# Patient Record
Sex: Male | Born: 2000 | Race: White | Hispanic: No | Marital: Single | Smoking: Never smoker
Health system: Southern US, Community
[De-identification: ages and names within clinical notes are randomized; demographics above are authoritative.]

## PROBLEM LIST (undated history)

## (undated) DIAGNOSIS — Z9889 Other specified postprocedural states: Secondary | ICD-10-CM

## (undated) DIAGNOSIS — Z8489 Family history of other specified conditions: Secondary | ICD-10-CM

## (undated) DIAGNOSIS — F909 Attention-deficit hyperactivity disorder, unspecified type: Secondary | ICD-10-CM

## (undated) DIAGNOSIS — K219 Gastro-esophageal reflux disease without esophagitis: Secondary | ICD-10-CM

## (undated) DIAGNOSIS — F419 Anxiety disorder, unspecified: Secondary | ICD-10-CM

## (undated) DIAGNOSIS — R112 Nausea with vomiting, unspecified: Secondary | ICD-10-CM

## (undated) HISTORY — PX: WISDOM TOOTH EXTRACTION: SHX21

## (undated) HISTORY — PX: TONSILLECTOMY: SUR1361

## (undated) HISTORY — DX: Attention-deficit hyperactivity disorder, unspecified type: F90.9

## (undated) HISTORY — PX: ADENOIDECTOMY: SUR15

## (undated) HISTORY — PX: TYMPANOSTOMY TUBE PLACEMENT: SHX32

---
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
## 2019-09-09 ENCOUNTER — Telehealth (INDEPENDENT_AMBULATORY_CARE_PROVIDER_SITE_OTHER): Payer: Self-pay | Admitting: Surgery

## 2019-09-09 NOTE — Telephone Encounter (Signed)
  Who's calling (name and relationship to patient) :mom / Dagmar Hait  Best contact number:[phone]  Provider they see:Dr. Gus Puma  Reason for call:mom would like to speak with Dr. Gus Puma to see if her son could possibly be worked in while he is home from school. She also has a few questions for him. Please advise mom      PRESCRIPTION REFILL ONLY  Name of prescription:  Pharmacy:

## 2019-09-10 NOTE — Telephone Encounter (Signed)
Called mom back and relayed that I can not answer any questions due to Castorland being over 18. She just wants to make an appointment for him since he is away at college.

## 2019-09-24 ENCOUNTER — Telehealth (INDEPENDENT_AMBULATORY_CARE_PROVIDER_SITE_OTHER): Payer: Self-pay | Admitting: Nurse Practitioner

## 2019-09-24 NOTE — Telephone Encounter (Signed)
Spoke with Ms. Alvester Morin regarding Marcus [name]'s upcoming surgery appointment and scheduling. Will keep the current scheduled appointment.

## 2019-09-29 ENCOUNTER — Telehealth (INDEPENDENT_AMBULATORY_CARE_PROVIDER_SITE_OTHER): Payer: Self-pay

## 2019-09-29 ENCOUNTER — Ambulatory Visit (INDEPENDENT_AMBULATORY_CARE_PROVIDER_SITE_OTHER): Payer: 59 | Admitting: Surgery

## 2019-09-29 ENCOUNTER — Encounter (INDEPENDENT_AMBULATORY_CARE_PROVIDER_SITE_OTHER): Payer: Self-pay | Admitting: Surgery

## 2019-09-29 ENCOUNTER — Other Ambulatory Visit: Payer: Self-pay

## 2019-09-29 VITALS — BP 120/80 | HR 72 | Ht 75.16 in | Wt 182.4 lb

## 2019-09-29 DIAGNOSIS — Q676 Pectus excavatum: Secondary | ICD-10-CM

## 2019-09-29 NOTE — Telephone Encounter (Signed)
Called to get a Prior approval for upcoming Jan 13, 2020 surgery for the removal of the implant bar. Received approval from 12/03/19-03/02/20. Reference number for the call is [id number]

## 2019-09-29 NOTE — Progress Notes (Signed)
Referring Provider: Monna Fam, MD  I had the pleasure of seeing Marcus [name] and his mother in the surgery clinic again. As you may recall, Marcus [name] is a 19 y.o. male who returns to the clinic today for follow-up regarding:  Chief Complaint  Patient presents with  . Follow-up    s/p pectus bar placement 05/2017    Marcus [name] is a 19 year old male s/p bar placement for pectus excavatum in December 2018 who comes to clinic for evaluation prior to bar removal. [name] has been doing relatively well since the operation. There have not been any episodes of bar displacement. Marcus [name] is happy with the pectus repair.  Problem List/Medical History: Active Ambulatory Problems    Diagnosis Date Noted  . Congenital pectus excavatum 05/29/2017  . Pneumothorax, postoperative 05/29/2017   Resolved Ambulatory Problems    Diagnosis Date Noted  . No Resolved Ambulatory Problems   Past Medical History:  Diagnosis Date  . Acid reflux   . ADHD (attention deficit hyperactivity disorder)   . Anxiety   . Family history of adverse reaction to anesthesia   . PONV (postoperative nausea and vomiting)     Surgical History: Past Surgical History:  Procedure Laterality Date  . ADENOIDECTOMY     around 58 months old  . REPAIR PECTUS EXCAVATUMPEDIATRIC N/A 05/29/2017   Procedure: RECONSTRUCTIVE REPAIR PECTUS EXCAVATUM PEDIATRIC;  Surgeon: Stanford Scotland, MD;  Location: [facility];  Service: Pediatrics;  Laterality: N/A;  . TONSILLECTOMY     around 19 years old  . TYMPANOSTOMY TUBE PLACEMENT      Family History: Family History  Problem Relation Age of Onset  . Hypertrophic cardiomyopathy Sister   . Hypertrophic cardiomyopathy Father        very mild  . Hypertrophic cardiomyopathy Brother   . Von Willebrand disease Maternal Aunt        type 1a  . Breast cancer Maternal Aunt     Social History: Social History   Socioeconomic History  . Marital status: Single    Spouse name: Not on file  .  Number of children: Not on file  . Years of education: Not on file  . Highest education level: Not on file  Occupational History  . Not on file  Tobacco Use  . Smoking status: Never Smoker  . Smokeless tobacco: Never Used  Substance and Sexual Activity  . Alcohol use: No  . Drug use: No  . Sexual activity: Not on file  Other Topics Concern  . Not on file  Social History Narrative   Goes to college at [employer]   Social Determinants of Health   Financial Resource Strain:   . Difficulty of Paying Living Expenses:   Food Insecurity:   . Worried About Charity fundraiser in the Last Year:   . Arboriculturist in the Last Year:   Transportation Needs:   . Film/video editor (Medical):   Marland Kitchen Lack of Transportation (Non-Medical):   Physical Activity:   . Days of Exercise per Week:   . Minutes of Exercise per Session:   Stress:   . Feeling of Stress :   Social Connections:   . Frequency of Communication with Friends and Family:   . Frequency of Social Gatherings with Friends and Family:   . Attends Religious Services:   . Active Member of Clubs or Organizations:   . Attends Archivist Meetings:   Marland Kitchen Marital Status:   Intimate Production manager  Violence:   . Fear of Current or Ex-Partner:   . Emotionally Abused:   Marland Kitchen Physically Abused:   . Sexually Abused:     Allergies: No Known Allergies  Medications: Current Outpatient Medications on File Prior to Visit  Medication Sig Dispense Refill  . ampicillin (PRINCIPEN) 500 MG capsule Take 500 mg by mouth 2 (two) times daily.    . calcium carbonate (TUMS - DOSED IN MG ELEMENTAL CALCIUM) 500 MG chewable tablet Chew 2 tablets by mouth daily as needed for indigestion or heartburn.    . methylphenidate (DAYTRANA) 30 MG/9HR Place 30 mg onto the skin daily.    Marland Kitchen adapalene (DIFFERIN) 0.1 % gel Apply 1 application topically daily as needed (acne).    Marland Kitchen oxyCODONE (OXY IR/ROXICODONE) 5 MG immediate release tablet Take  1 tablet (5 mg total) by mouth every 4 (four) hours as needed for up to 10 doses for moderate pain or breakthrough pain (pain 5-7 of 10). (Patient not taking: Reported on 06/25/2017) 10 tablet 0  . senna-docusate (SENOKOT-S) 8.6-50 MG tablet Take 1 tablet by mouth at bedtime. (Patient not taking: Reported on 06/25/2017) 10 tablet 0  . venlafaxine (EFFEXOR) 50 MG tablet Take 50 mg by mouth at bedtime.   3   No current facility-administered medications on file prior to visit.    Review of Systems: Review of Systems  All other systems reviewed and are negative.    Today's Vitals   09/29/19 0956  BP: 120/80  Pulse: 72  Weight: 182 lb 6.4 oz (82.7 kg)  Height: 6' 3.16" (1.909 m)     Physical Exam: General: healthy, alert, appears stated age, not in distress Head, Ears, Nose, Throat: Normal Eyes: Normal Neck: Normal Lungs: Unlabored breathing Chest: normal appearing, well healed scars on lateral chest bilaterally Cardiac: regular rate and rhythm Abdomen: abdomen soft and non-tender Genital: deferred Rectal: deferred Musculoskeletal/Extremities: Normal symmetric bulk and strength Skin:No rashes or abnormal dyspigmentation Neuro: Mental status normal, no cranial nerve deficits, normal strength and tone, normal gait   Recent Studies: None  Assessment/Impression and Plan: Marcus [name] is a 19 year old male s/p bar placement for pectus excavatum. I recommend bar removal. I explained the procedure to Marcus [name] and his mother. Risks of the procedure include bleeding, injury (skin, muscle, nerves, vessels, ribs, heart, and lungs), infection, wound dehiscence, recurrence of pectus excavatum, sepsis, and death. Marcus [name] and mother understand the procedure and wish to proceed. Marcus [name] will be away for most of the summer, returning in August. We will schedule the operation for August 11 in the [name] operating room. I informed Marcus [name] that although the tentative plan is to discharge the same day,  disposition will depend on the outcome of the operation.  Thank you for allowing me to see this patient.    Kandice Hams, MD, [facility] Pediatric Surgeon

## 2019-12-30 NOTE — Progress Notes (Signed)
Pt made aware of the Covid drive-thru testing location change to [address] [facility].This is still a drive-thru, as well.

## 2020-01-04 ENCOUNTER — Telehealth (INDEPENDENT_AMBULATORY_CARE_PROVIDER_SITE_OTHER): Payer: Self-pay | Admitting: Surgery

## 2020-01-04 NOTE — Telephone Encounter (Signed)
I attempted to return Ms. Lakey's phone call. Left voicemail requesting a return call at [phone].

## 2020-01-04 NOTE — Telephone Encounter (Signed)
  Who's calling (name and relationship to patient) : Revonda Standard (mom)  Best contact number: [phone]  Provider they see: Dr. Gus Puma  Reason for call: Mom states that patient has surgery scheduled with Dr. Gus Puma on 8/11 - she has some questions and requests call back.    PRESCRIPTION REFILL ONLY  Name of prescription:  Pharmacy:

## 2020-01-04 NOTE — Telephone Encounter (Signed)
I called Marcus [name] to find out what questions mother may have. Marcus [name] stated they had a question on COVID testing date but he figured it out. Marcus [name] has no further questions.   Marcus Woulfe O. Trev Boley, MD, MHS

## 2020-01-04 NOTE — Telephone Encounter (Signed)
I spoke with Ms. Usery. She asked if [name] needed another x-ray prior to surgery, which I stated was not neccessary.  She stated [name] was very nervous about getting an IV on the day of surgery. She asked if it would be ok for [name] to take a xanax prior to arrival. I advised this would be a question for the anesthesiologist. Shalev should be receiving a pre-op phone call within the next few days. Ms. Dissinger verbalized understanding.

## 2020-01-09 ENCOUNTER — Other Ambulatory Visit (HOSPITAL_COMMUNITY)
Admission: RE | Admit: 2020-01-09 | Discharge: 2020-01-09 | Disposition: A | Discharge status: Home / Self Care | Payer: 59 | Source: Ambulatory Visit | Attending: Surgery | Admitting: Surgery

## 2020-01-09 DIAGNOSIS — Z01812 Encounter for preprocedural laboratory examination: Secondary | ICD-10-CM | POA: Insufficient documentation

## 2020-01-09 DIAGNOSIS — Z20822 Contact with and (suspected) exposure to covid-19: Secondary | ICD-10-CM | POA: Diagnosis not present

## 2020-01-09 LAB — SARS CORONAVIRUS 2 (TAT 6-24 HRS): SARS Coronavirus 2: NEGATIVE

## 2020-01-12 ENCOUNTER — Telehealth (INDEPENDENT_AMBULATORY_CARE_PROVIDER_SITE_OTHER): Payer: Self-pay | Admitting: Surgery

## 2020-01-12 ENCOUNTER — Encounter (HOSPITAL_COMMUNITY): Payer: Self-pay | Admitting: Surgery

## 2020-01-12 ENCOUNTER — Other Ambulatory Visit: Payer: Self-pay

## 2020-01-12 NOTE — Anesthesia Preprocedure Evaluation (Addendum)
Anesthesia Evaluation  Patient identified by MRN, date of birth, ID band Patient awake    Reviewed: Allergy & Precautions, NPO status , Patient's Chart, lab work & pertinent test results  History of Anesthesia Complications (+) PONV and history of anesthetic complications  Airway Mallampati: I  TM Distance: >3 FB Neck ROM: Full    Dental no notable dental hx. (+) Teeth Intact, Dental Advisory Given   Pulmonary neg pulmonary ROS,    Pulmonary exam normal breath sounds clear to auscultation       Cardiovascular negative cardio ROS Normal cardiovascular exam Rhythm:Regular Rate:Normal     Neuro/Psych PSYCHIATRIC DISORDERS Anxiety negative neurological ROS     GI/Hepatic Neg liver ROS, GERD  Controlled,  Endo/Other  negative endocrine ROS  Renal/GU negative Renal ROS  negative genitourinary   Musculoskeletal negative musculoskeletal ROS (+)   Abdominal   Peds  Hematology negative hematology ROS (+)   Anesthesia Other Findings Congenital pectus excavatum s/p nuss bar placement 2018, postop PTX  Reproductive/Obstetrics negative OB ROS                           Anesthesia Physical Anesthesia Plan  ASA: II  Anesthesia Plan: General   Post-op Pain Management:    Induction: Intravenous  PONV Risk Score and Plan: Ondansetron, Dexamethasone, Propofol infusion, Midazolam, Diphenhydramine and Treatment may vary due to age or medical condition  Airway Management Planned: Oral ETT  Additional Equipment: None  Intra-op Plan:   Post-operative Plan: Extubation in OR  Informed Consent: I have reviewed the patients History and Physical, chart, labs and discussed the procedure including the risks, benefits and alternatives for the proposed anesthesia with the patient or authorized representative who has indicated his/her understanding and acceptance.     Dental advisory given and Consent  reviewed with POA  Plan Discussed with: CRNA  Anesthesia Plan Comments: (D/w patient and parents rare possibility of blood transfusion, 2nd IV and intraop arterial line)      Anesthesia Quick Evaluation

## 2020-01-12 NOTE — Progress Notes (Signed)
Pt denies SOB, chest pain, and being under the care of a cardiologist. Pt stated That PCP is Dr. Aggie Hacker. Pt denies having an EKG, chest x ray and recent labs. Pt made aware to stop taking  Aspirin (unless otherwise advised by surgeon), vitamins, fish oil and herbal medications. Do not take any NSAIDs ie: Ibuprofen, Advil, Naproxen (Aleve), Motrin, BC and Goody Powder. Pt reminded to quarantine. Pt verbalized understanding of all pre-op instructions.

## 2020-01-12 NOTE — Telephone Encounter (Signed)
Returned [employer] phone call. Left message to call the office back.

## 2020-01-12 NOTE — Telephone Encounter (Signed)
Who's calling (name and relationship to patient) : Anastasio Wogan mom   Best contact number: [phone]  Provider they see: Dr. Gus Puma  Reason for call: Patient would like to know what time surgery is  Call ID:      PRESCRIPTION REFILL ONLY  Name of prescription:  Pharmacy:

## 2020-01-12 NOTE — Telephone Encounter (Signed)
I returned [name]'s phone call. I left a message stating Marcus [name] would receive a phone call from the [facility] at some point today regarding his surgery arrival time.

## 2020-01-13 ENCOUNTER — Ambulatory Visit (HOSPITAL_COMMUNITY): Payer: 59

## 2020-01-13 ENCOUNTER — Ambulatory Visit (HOSPITAL_COMMUNITY): Payer: 59 | Admitting: Anesthesiology

## 2020-01-13 ENCOUNTER — Encounter (HOSPITAL_COMMUNITY)
Admission: RE | Disposition: A | Discharge status: Home / Self Care | Payer: Self-pay | Source: Home / Self Care | Attending: Surgery

## 2020-01-13 ENCOUNTER — Observation Stay (HOSPITAL_COMMUNITY)
Admission: RE | Admit: 2020-01-13 | Discharge: 2020-01-14 | Disposition: A | Discharge status: Home / Self Care | Payer: 59 | Attending: Surgery | Admitting: Surgery

## 2020-01-13 ENCOUNTER — Other Ambulatory Visit: Payer: Self-pay

## 2020-01-13 DIAGNOSIS — Q676 Pectus excavatum: Principal | ICD-10-CM | POA: Insufficient documentation

## 2020-01-13 DIAGNOSIS — F909 Attention-deficit hyperactivity disorder, unspecified type: Secondary | ICD-10-CM | POA: Diagnosis not present

## 2020-01-13 HISTORY — PX: PECTUS BAR REMOVAL PEDIATRIC: SHX6759

## 2020-01-13 LAB — TYPE AND SCREEN
ABO/RH(D): A POS
Antibody Screen: NEGATIVE

## 2020-01-13 SURGERY — REMOVAL, PECTUS EXCAVATUM BAR, PEDIATRIC
Anesthesia: General | Site: Chest

## 2020-01-13 MED ORDER — HYDROMORPHONE HCL 1 MG/ML IJ SOLN
INTRAMUSCULAR | Status: AC
  Filled 2020-01-13: qty 1

## 2020-01-13 MED ORDER — ACETAMINOPHEN 500 MG PO TABS
1000.0000 mg | ORAL_TABLET | Freq: Four times a day (QID) | ORAL | Status: DC
  Administered 2020-01-14 (×2): 1000 mg via ORAL
  Filled 2020-01-13 (×3): qty 2

## 2020-01-13 MED ORDER — SODIUM CHLORIDE (PF) 0.9 % IJ SOLN
INTRAMUSCULAR | Status: DC | PRN
  Administered 2020-01-13: 40 mL

## 2020-01-13 MED ORDER — 0.9 % SODIUM CHLORIDE (POUR BTL) OPTIME
TOPICAL | Status: DC | PRN
  Administered 2020-01-13: 1000 mL

## 2020-01-13 MED ORDER — DIPHENHYDRAMINE HCL 50 MG/ML IJ SOLN
INTRAMUSCULAR | Status: DC | PRN
  Administered 2020-01-13: 12.5 mg via INTRAVENOUS

## 2020-01-13 MED ORDER — LIDOCAINE-EPINEPHRINE 1 %-1:100000 IJ SOLN
INTRAMUSCULAR | Status: DC | PRN
  Administered 2020-01-13: 20 mL

## 2020-01-13 MED ORDER — ACETAMINOPHEN 10 MG/ML IV SOLN
INTRAVENOUS | Status: DC | PRN
Start: 2020-01-13 — End: 2020-01-13
  Administered 2020-01-13: 1000 mg via INTRAVENOUS

## 2020-01-13 MED ORDER — PROPOFOL 1000 MG/100ML IV EMUL
INTRAVENOUS | Status: AC
  Filled 2020-01-13: qty 100

## 2020-01-13 MED ORDER — KCL IN DEXTROSE-NACL 20-5-0.9 MEQ/L-%-% IV SOLN
INTRAVENOUS | Status: DC
  Filled 2020-01-13 (×2): qty 1000

## 2020-01-13 MED ORDER — ROCURONIUM BROMIDE 10 MG/ML (PF) SYRINGE
PREFILLED_SYRINGE | INTRAVENOUS | Status: AC
  Filled 2020-01-13: qty 10

## 2020-01-13 MED ORDER — FENTANYL CITRATE (PF) 250 MCG/5ML IJ SOLN
INTRAMUSCULAR | Status: AC
  Filled 2020-01-13: qty 5

## 2020-01-13 MED ORDER — PROPOFOL 10 MG/ML IV BOLUS
INTRAVENOUS | Status: DC | PRN
  Administered 2020-01-13: 200 mg via INTRAVENOUS

## 2020-01-13 MED ORDER — KETAMINE HCL 10 MG/ML IJ SOLN
INTRAMUSCULAR | Status: DC | PRN
Start: 2020-01-13 — End: 2020-01-13
  Administered 2020-01-13 (×2): 10 mg via INTRAVENOUS
  Administered 2020-01-13: 40 mg via INTRAVENOUS
  Administered 2020-01-13: 10 mg via INTRAVENOUS

## 2020-01-13 MED ORDER — PROPOFOL 500 MG/50ML IV EMUL
INTRAVENOUS | Status: DC | PRN
Start: 2020-01-13 — End: 2020-01-13
  Administered 2020-01-13: 25 ug/kg/min via INTRAVENOUS

## 2020-01-13 MED ORDER — OXYCODONE HCL 5 MG PO TABS: 5.0000 mg | ORAL_TABLET | Freq: Once | ORAL | Status: DC | PRN

## 2020-01-13 MED ORDER — MORPHINE SULFATE (PF) 4 MG/ML IV SOLN: 5.0000 mg | INTRAVENOUS | Status: DC | PRN

## 2020-01-13 MED ORDER — CHLORHEXIDINE GLUCONATE 0.12 % MT SOLN
15.0000 mL | Freq: Once | OROMUCOSAL | Status: AC
  Administered 2020-01-13: 15 mL via OROMUCOSAL

## 2020-01-13 MED ORDER — HYDROMORPHONE HCL 1 MG/ML IJ SOLN
0.2500 mg | INTRAMUSCULAR | Status: DC | PRN
  Administered 2020-01-13 (×2): 0.5 mg via INTRAVENOUS

## 2020-01-13 MED ORDER — LACTATED RINGERS IV SOLN: INTRAVENOUS | Status: DC | PRN

## 2020-01-13 MED ORDER — PROPOFOL 10 MG/ML IV BOLUS
INTRAVENOUS | Status: AC
  Filled 2020-01-13: qty 40

## 2020-01-13 MED ORDER — ONDANSETRON HCL 4 MG/2ML IJ SOLN
INTRAMUSCULAR | Status: AC
  Filled 2020-01-13: qty 2

## 2020-01-13 MED ORDER — OXYCODONE HCL 5 MG PO TABS
5.0000 mg | ORAL_TABLET | ORAL | Status: DC | PRN
  Administered 2020-01-13 – 2020-01-14 (×2): 5 mg via ORAL
  Filled 2020-01-13 (×2): qty 1

## 2020-01-13 MED ORDER — CEFAZOLIN SODIUM-DEXTROSE 2-3 GM-%(50ML) IV SOLR
INTRAVENOUS | Status: DC | PRN
  Administered 2020-01-13 (×2): 2 g via INTRAVENOUS

## 2020-01-13 MED ORDER — HEMOSTATIC AGENTS (NO CHARGE) OPTIME
TOPICAL | Status: DC | PRN
  Administered 2020-01-13: 1 via TOPICAL

## 2020-01-13 MED ORDER — CEFAZOLIN SODIUM 1 G IJ SOLR
INTRAMUSCULAR | Status: AC
  Filled 2020-01-13: qty 20

## 2020-01-13 MED ORDER — CEFAZOLIN SODIUM-DEXTROSE 1-4 GM/50ML-% IV SOLN: 1.0000 g | INTRAVENOUS | Status: DC

## 2020-01-13 MED ORDER — LIDOCAINE 2% (20 MG/ML) 5 ML SYRINGE
INTRAMUSCULAR | Status: AC
  Filled 2020-01-13: qty 5

## 2020-01-13 MED ORDER — ALBUMIN HUMAN 5 % IV SOLN
INTRAVENOUS | Status: DC | PRN
Start: 2020-01-13 — End: 2020-01-13

## 2020-01-13 MED ORDER — HYDROMORPHONE HCL 1 MG/ML IJ SOLN
INTRAMUSCULAR | Status: AC
  Filled 2020-01-13: qty 0.5

## 2020-01-13 MED ORDER — HYDROMORPHONE HCL 1 MG/ML IJ SOLN
INTRAMUSCULAR | Status: DC | PRN
  Administered 2020-01-13 (×2): .5 mg via INTRAVENOUS

## 2020-01-13 MED ORDER — MIDAZOLAM HCL 2 MG/2ML IJ SOLN
INTRAMUSCULAR | Status: AC
  Filled 2020-01-13: qty 2

## 2020-01-13 MED ORDER — ACETAMINOPHEN 500 MG PO TABS: 1000.0000 mg | ORAL_TABLET | Freq: Once | ORAL | Status: AC

## 2020-01-13 MED ORDER — ALPRAZOLAM 0.5 MG PO TABS: 0.5000 mg | ORAL_TABLET | Freq: Every evening | ORAL | Status: DC | PRN

## 2020-01-13 MED ORDER — LIDOCAINE 2% (20 MG/ML) 5 ML SYRINGE
INTRAMUSCULAR | Status: DC | PRN
  Administered 2020-01-13: 60 mg via INTRAVENOUS

## 2020-01-13 MED ORDER — LACTATED RINGERS IV SOLN
INTRAVENOUS | Status: DC | PRN
Start: 2020-01-13 — End: 2020-01-13

## 2020-01-13 MED ORDER — DEXAMETHASONE SODIUM PHOSPHATE 10 MG/ML IJ SOLN
INTRAMUSCULAR | Status: DC | PRN
  Administered 2020-01-13: 10 mg via INTRAVENOUS

## 2020-01-13 MED ORDER — ROCURONIUM BROMIDE 10 MG/ML (PF) SYRINGE
PREFILLED_SYRINGE | INTRAVENOUS | Status: DC | PRN
  Administered 2020-01-13: 30 mg via INTRAVENOUS
  Administered 2020-01-13: 20 mg via INTRAVENOUS
  Administered 2020-01-13: 30 mg via INTRAVENOUS
  Administered 2020-01-13 (×2): 20 mg via INTRAVENOUS
  Administered 2020-01-13: 70 mg via INTRAVENOUS
  Administered 2020-01-13 (×4): 20 mg via INTRAVENOUS

## 2020-01-13 MED ORDER — DIPHENHYDRAMINE HCL 50 MG/ML IJ SOLN
INTRAMUSCULAR | Status: AC
  Filled 2020-01-13: qty 1

## 2020-01-13 MED ORDER — PHENYLEPHRINE HCL-NACL 10-0.9 MG/250ML-% IV SOLN
INTRAVENOUS | Status: DC | PRN
  Administered 2020-01-13: 20 ug/min via INTRAVENOUS

## 2020-01-13 MED ORDER — DEXMEDETOMIDINE (PRECEDEX) IN NS 20 MCG/5ML (4 MCG/ML) IV SYRINGE
PREFILLED_SYRINGE | INTRAVENOUS | Status: AC
  Filled 2020-01-13: qty 10

## 2020-01-13 MED ORDER — LIDOCAINE-EPINEPHRINE 1 %-1:100000 IJ SOLN
INTRAMUSCULAR | Status: AC
  Filled 2020-01-13: qty 1

## 2020-01-13 MED ORDER — OXYCODONE HCL 5 MG/5ML PO SOLN: 5.0000 mg | Freq: Once | ORAL | Status: DC | PRN

## 2020-01-13 MED ORDER — FENTANYL CITRATE (PF) 100 MCG/2ML IJ SOLN
INTRAMUSCULAR | Status: DC | PRN
  Administered 2020-01-13 (×6): 50 ug via INTRAVENOUS

## 2020-01-13 MED ORDER — ACETAMINOPHEN 10 MG/ML IV SOLN
INTRAVENOUS | Status: AC
  Filled 2020-01-13: qty 100

## 2020-01-13 MED ORDER — DEXMEDETOMIDINE (PRECEDEX) IN NS 20 MCG/5ML (4 MCG/ML) IV SYRINGE
PREFILLED_SYRINGE | INTRAVENOUS | Status: DC | PRN
  Administered 2020-01-13 (×2): 8 ug via INTRAVENOUS
  Administered 2020-01-13: 12 ug via INTRAVENOUS
  Administered 2020-01-13: 4 ug via INTRAVENOUS
  Administered 2020-01-13: 8 ug via INTRAVENOUS

## 2020-01-13 MED ORDER — CEFAZOLIN SODIUM-DEXTROSE 2-4 GM/100ML-% IV SOLN
INTRAVENOUS | Status: AC
  Filled 2020-01-13: qty 100

## 2020-01-13 MED ORDER — PHENYLEPHRINE 40 MCG/ML (10ML) SYRINGE FOR IV PUSH (FOR BLOOD PRESSURE SUPPORT)
PREFILLED_SYRINGE | INTRAVENOUS | Status: AC
  Filled 2020-01-13: qty 10

## 2020-01-13 MED ORDER — MEPERIDINE HCL 25 MG/ML IJ SOLN: 6.2500 mg | INTRAMUSCULAR | Status: DC | PRN

## 2020-01-13 MED ORDER — KETOROLAC TROMETHAMINE 30 MG/ML IJ SOLN
INTRAMUSCULAR | Status: AC
  Filled 2020-01-13: qty 1

## 2020-01-13 MED ORDER — ONDANSETRON HCL 4 MG/2ML IJ SOLN
INTRAMUSCULAR | Status: DC | PRN
  Administered 2020-01-13 (×2): 4 mg via INTRAVENOUS

## 2020-01-13 MED ORDER — IBUPROFEN 600 MG PO TABS
600.0000 mg | ORAL_TABLET | Freq: Four times a day (QID) | ORAL | Status: DC
  Administered 2020-01-13 – 2020-01-14 (×3): 600 mg via ORAL
  Filled 2020-01-13 (×3): qty 1

## 2020-01-13 MED ORDER — PHENYLEPHRINE 40 MCG/ML (10ML) SYRINGE FOR IV PUSH (FOR BLOOD PRESSURE SUPPORT)
PREFILLED_SYRINGE | INTRAVENOUS | Status: DC | PRN
  Administered 2020-01-13 (×3): 80 ug via INTRAVENOUS

## 2020-01-13 MED ORDER — BUPIVACAINE LIPOSOME 1.3 % IJ SUSP
INTRAMUSCULAR | Status: DC | PRN
  Administered 2020-01-13: 20 mL

## 2020-01-13 MED ORDER — ONDANSETRON HCL 4 MG/2ML IJ SOLN
4.0000 mg | Freq: Four times a day (QID) | INTRAMUSCULAR | Status: DC | PRN
  Administered 2020-01-13: 4 mg via INTRAVENOUS
  Filled 2020-01-13: qty 2

## 2020-01-13 MED ORDER — SUGAMMADEX SODIUM 200 MG/2ML IV SOLN
INTRAVENOUS | Status: DC | PRN
Start: 2020-01-13 — End: 2020-01-13
  Administered 2020-01-13: 200 mg via INTRAVENOUS

## 2020-01-13 MED ORDER — LACTATED RINGERS IV SOLN: INTRAVENOUS | Status: DC

## 2020-01-13 MED ORDER — KETAMINE HCL 50 MG/5ML IJ SOSY
PREFILLED_SYRINGE | INTRAMUSCULAR | Status: AC
  Filled 2020-01-13: qty 5

## 2020-01-13 MED ORDER — MIDAZOLAM HCL 5 MG/5ML IJ SOLN
INTRAMUSCULAR | Status: DC | PRN
  Administered 2020-01-13: 2 mg via INTRAVENOUS

## 2020-01-13 MED ORDER — ORAL CARE MOUTH RINSE: 15.0000 mL | Freq: Once | OROMUCOSAL | Status: AC

## 2020-01-13 MED ORDER — KETOROLAC TROMETHAMINE 30 MG/ML IJ SOLN: 30.0000 mg | Freq: Once | INTRAMUSCULAR | Status: DC | PRN

## 2020-01-13 MED ORDER — DEXAMETHASONE SODIUM PHOSPHATE 10 MG/ML IJ SOLN
INTRAMUSCULAR | Status: AC
  Filled 2020-01-13: qty 1

## 2020-01-13 MED ORDER — BUPIVACAINE LIPOSOME 1.3 % IJ SUSP
20.0000 mL | Freq: Once | INTRAMUSCULAR | Status: DC
  Filled 2020-01-13: qty 20

## 2020-01-13 MED ORDER — ACETAMINOPHEN 500 MG PO TABS
ORAL_TABLET | ORAL | Status: AC
  Administered 2020-01-13: 1000 mg via ORAL
  Filled 2020-01-13: qty 2

## 2020-01-13 MED ORDER — PROMETHAZINE HCL 25 MG/ML IJ SOLN: 6.2500 mg | INTRAMUSCULAR | Status: DC | PRN

## 2020-01-13 SURGICAL SUPPLY — 63 items
BNDG ELASTIC 6X15 VLCR STRL LF (GAUZE/BANDAGES/DRESSINGS) ×3 IMPLANT
CANISTER SUCT 3000ML PPV (MISCELLANEOUS) ×3 IMPLANT
CATH ROBINSON RED A/P 10FR (CATHETERS) ×3 IMPLANT
CHLORAPREP W/TINT 26 (MISCELLANEOUS) ×3 IMPLANT
CLOSURE WOUND 1/2 X4 (GAUZE/BANDAGES/DRESSINGS) ×1
COVER MAYO STAND STRL (DRAPES) ×6 IMPLANT
COVER SURGICAL LIGHT HANDLE (MISCELLANEOUS) ×3 IMPLANT
COVER WAND RF STERILE (DRAPES) ×3 IMPLANT
DERMABOND ADVANCED (GAUZE/BANDAGES/DRESSINGS) ×4
DERMABOND ADVANCED .7 DNX12 (GAUZE/BANDAGES/DRESSINGS) ×2 IMPLANT
DRAPE C-ARM 42X120 X-RAY (DRAPES) ×6 IMPLANT
DRAPE INCISE IOBAN 66X45 STRL (DRAPES) ×6 IMPLANT
DRAPE ORTHO SPLIT 77X108 STRL (DRAPES) ×6
DRAPE SURG ORHT 6 SPLT 77X108 (DRAPES) ×2 IMPLANT
DRAPE WARM FLUID 44X44 (DRAPES) IMPLANT
DRSG TEGADERM 4X4.75 (GAUZE/BANDAGES/DRESSINGS) ×3 IMPLANT
DRSG TELFA 3X8 NADH (GAUZE/BANDAGES/DRESSINGS) ×3 IMPLANT
ELECT COATED BLADE 2.86 ST (ELECTRODE) ×6 IMPLANT
ELECT REM PT RETURN 9FT ADLT (ELECTROSURGICAL)
ELECTRODE REM PT RTRN 9FT ADLT (ELECTROSURGICAL) IMPLANT
GLOVE BIO SURGEON STRL SZ7.5 (GLOVE) ×3 IMPLANT
GLOVE BIOGEL PI IND STRL 8 (GLOVE) ×1 IMPLANT
GLOVE BIOGEL PI INDICATOR 8 (GLOVE) ×2
GLOVE SURG SS PI 7.5 STRL IVOR (GLOVE) ×3 IMPLANT
GOWN STRL REUS W/ TWL LRG LVL3 (GOWN DISPOSABLE) ×2 IMPLANT
GOWN STRL REUS W/ TWL XL LVL3 (GOWN DISPOSABLE) ×1 IMPLANT
GOWN STRL REUS W/TWL LRG LVL3 (GOWN DISPOSABLE) ×6
GOWN STRL REUS W/TWL XL LVL3 (GOWN DISPOSABLE) ×3
GRASPER SUT TROCAR 14GX15 (MISCELLANEOUS) ×3 IMPLANT
HEMOSTAT SURGICEL 2X14 (HEMOSTASIS) ×3 IMPLANT
KIT BASIN OR (CUSTOM PROCEDURE TRAY) ×3 IMPLANT
KIT TURNOVER KIT B (KITS) ×3 IMPLANT
MARKER SKIN DUAL TIP RULER LAB (MISCELLANEOUS) ×6 IMPLANT
NEEDLE HYPO 25GX1X1/2 BEV (NEEDLE) ×3 IMPLANT
NS IRRIG 1000ML POUR BTL (IV SOLUTION) ×3 IMPLANT
PENCIL BUTTON HOLSTER BLD 10FT (ELECTRODE) ×6 IMPLANT
RETRACTOR ONETRAX LX 90X20 (MISCELLANEOUS) ×3 IMPLANT
SET TUBE SMOKE EVAC HIGH FLOW (TUBING) ×6 IMPLANT
SPONGE LAP 18X18 RF (DISPOSABLE) ×9 IMPLANT
STRIP CLOSURE SKIN 1/2X4 (GAUZE/BANDAGES/DRESSINGS) ×2 IMPLANT
SUT ETHIBOND NAB CT1 #1 30IN (SUTURE) IMPLANT
SUT MON AB 4-0 PC3 18 (SUTURE) IMPLANT
SUT MON AB 4-0 PS1 27 (SUTURE) ×6 IMPLANT
SUT PDS AB 1 TP1 96 (SUTURE) IMPLANT
SUT SILK 0 TIES 10X30 (SUTURE) ×3 IMPLANT
SUT VIC AB 2-0 CT1 (SUTURE) ×6 IMPLANT
SUT VIC AB 2-0 CT1 27 (SUTURE)
SUT VIC AB 2-0 CT1 TAPERPNT 27 (SUTURE) IMPLANT
SUT VIC AB 3-0 SH 27 (SUTURE)
SUT VIC AB 3-0 SH 27XBRD (SUTURE) IMPLANT
SUT VICRYL 0 UR6 27IN ABS (SUTURE) IMPLANT
SUT VICRYL 3 0 CT 3 (SUTURE) ×6 IMPLANT
TAPE MEASURE VINYL STERILE (MISCELLANEOUS) ×6 IMPLANT
TAPE UMBILICAL COTTON 1/8X30 (MISCELLANEOUS) IMPLANT
TOWEL GREEN STERILE (TOWEL DISPOSABLE) ×3 IMPLANT
TRAY FOLEY W/BAG SLVR 16FR (SET/KITS/TRAYS/PACK) ×2
TRAY FOLEY W/BAG SLVR 16FR ST (SET/KITS/TRAYS/PACK) ×1 IMPLANT
TRAY LAPAROSCOPIC MC (CUSTOM PROCEDURE TRAY) ×3 IMPLANT
TROCAR PEDIATRIC 5X55MM (TROCAR) ×6 IMPLANT
TUBE CONNECTING 12'X1/4 (SUCTIONS) ×1
TUBE CONNECTING 12X1/4 (SUCTIONS) ×2 IMPLANT
TUBING BULK SUCTION (MISCELLANEOUS) ×3 IMPLANT
YANKAUER SUCT BULB TIP NO VENT (SUCTIONS) ×3 IMPLANT

## 2020-01-13 NOTE — H&P (Signed)
The following history and physical was copied from an encounter dated 09/29/19. I have personally examined the patient today and there have been no significant changes.  Referring Provider: Aggie Hacker, MD  I had the pleasure of seeing Marcus [name] and his mother in the surgery clinic again. As you may recall, Marcus [name] is a 19 y.o. male who returns to the clinic today for follow-up regarding:      Chief Complaint  Patient presents with  . Follow-up    s/p pectus bar placement 05/2017    Marcus [name] is a 19 year old male s/p bar placement for pectus excavatum in December 2018 who comes to clinic for evaluation prior to bar removal. Marcus [name] has been doing relatively well since the operation. There have not been any episodes of bar displacement. Marcus [name] is happy with the pectus repair.  Problem List/Medical History:     Active Ambulatory Problems    Diagnosis Date Noted  . Congenital pectus excavatum 05/29/2017  . Pneumothorax, postoperative 05/29/2017       Resolved Ambulatory Problems    Diagnosis Date Noted  . No Resolved Ambulatory Problems       Past Medical History:  Diagnosis Date  . Acid reflux   . ADHD (attention deficit hyperactivity disorder)   . Anxiety   . Family history of adverse reaction to anesthesia   . PONV (postoperative nausea and vomiting)     Surgical History:      Past Surgical History:  Procedure Laterality Date  . ADENOIDECTOMY     around 103 months old  . REPAIR PECTUS EXCAVATUMPEDIATRIC N/A 05/29/2017   Procedure: RECONSTRUCTIVE REPAIR PECTUS EXCAVATUM PEDIATRIC;  Surgeon: Kandice Hams, MD;  Location: [facility] OR;  Service: Pediatrics;  Laterality: N/A;  . TONSILLECTOMY     around 19 years old  . TYMPANOSTOMY TUBE PLACEMENT      Family History:      Family History  Problem Relation Age of Onset  . Hypertrophic cardiomyopathy Sister   . Hypertrophic cardiomyopathy Father        very mild  . Hypertrophic cardiomyopathy  Brother   . Von Willebrand disease Maternal Aunt        type 1a  . Breast cancer Maternal Aunt     Social History: Social History        Socioeconomic History  . Marital status: Single    Spouse name: Not on file  . Number of children: Not on file  . Years of education: Not on file  . Highest education level: Not on file  Occupational History  . Not on file  Tobacco Use  . Smoking status: Never Smoker  . Smokeless tobacco: Never Used  Substance and Sexual Activity  . Alcohol use: No  . Drug use: No  . Sexual activity: Not on file  Other Topics Concern  . Not on file  Social History Narrative   Goes to college at [employer]   Social Determinants of Health      Financial Resource Strain:   . Difficulty of Paying Living Expenses:   Food Insecurity:   . Worried About Programme researcher, broadcasting/film/video in the Last Year:   . Barista in the Last Year:   Transportation Needs:   . Freight forwarder (Medical):   Marland Kitchen Lack of Transportation (Non-Medical):   Physical Activity:   . Days of Exercise per Week:   . Minutes of Exercise per Session:   Stress:   . Feeling  of Stress :   Social Connections:   . Frequency of Communication with Friends and Family:   . Frequency of Social Gatherings with Friends and Family:   . Attends Religious Services:   . Active Member of Clubs or Organizations:   . Attends Banker Meetings:   Marland Kitchen Marital Status:   Intimate Partner Violence:   . Fear of Current or Ex-Partner:   . Emotionally Abused:   Marland Kitchen Physically Abused:   . Sexually Abused:     Allergies: No Known Allergies  Medications:       Current Outpatient Medications on File Prior to Visit  Medication Sig Dispense Refill  . ampicillin (PRINCIPEN) 500 MG capsule Take 500 mg by mouth 2 (two) times daily.    . calcium carbonate (TUMS - DOSED IN MG ELEMENTAL CALCIUM) 500 MG chewable tablet Chew 2 tablets by mouth daily as needed for  indigestion or heartburn.    . methylphenidate (DAYTRANA) 30 MG/9HR Place 30 mg onto the skin daily.    Marland Kitchen adapalene (DIFFERIN) 0.1 % gel Apply 1 application topically daily as needed (acne).    Marland Kitchen oxyCODONE (OXY IR/ROXICODONE) 5 MG immediate release tablet Take 1 tablet (5 mg total) by mouth every 4 (four) hours as needed for up to 10 doses for moderate pain or breakthrough pain (pain 5-7 of 10). (Patient not taking: Reported on 06/25/2017) 10 tablet 0  . senna-docusate (SENOKOT-S) 8.6-50 MG tablet Take 1 tablet by mouth at bedtime. (Patient not taking: Reported on 06/25/2017) 10 tablet 0  . venlafaxine (EFFEXOR) 50 MG tablet Take 50 mg by mouth at bedtime.   3   No current facility-administered medications on file prior to visit.    Review of Systems: Review of Systems  All other systems reviewed and are negative.       Today's Vitals   09/29/19 0956  BP: 120/80  Pulse: 72  Weight: 182 lb 6.4 oz (82.7 kg)  Height: 6' 3.16" (1.909 m)     Physical Exam: General: healthy, alert, appears stated age, not in distress Head, Ears, Nose, Throat: Normal Eyes: Normal Neck: Normal Lungs: Unlabored breathing Chest: normal appearing, well healed scars on lateral chest bilaterally Cardiac: regular rate and rhythm Abdomen: abdomen soft and non-tender Genital: deferred Rectal: deferred Musculoskeletal/Extremities: Normal symmetric bulk and strength Skin:No rashes or abnormal dyspigmentation Neuro: Mental status normal, no cranial nerve deficits, normal strength and tone, normal gait   Recent Studies: None  Assessment/Impression and Plan: [name] is a 19 year old male s/p bar placement for pectus excavatum. I recommend bar removal. I explained the procedure to Marcus [name] and his mother. Risks of the procedure include bleeding, injury (skin, muscle, nerves, vessels, ribs, heart, and lungs), infection, wound dehiscence, recurrence of pectus excavatum, sepsis, and death. Marcus [name] and  mother understand the procedure and wish to proceed. Marcus [name] will be away for most of the summer, returning in August. We will schedule the operation for August 11 in the [facility] operating room. I informed Marcus [name] that although the tentative plan is to discharge the same day, disposition will depend on the outcome of the operation.  Thank you for allowing me to see this patient.    Kandice Hams, MD, M[facility] Pediatric Surgeon

## 2020-01-13 NOTE — Transfer of Care (Signed)
Immediate Anesthesia Transfer of Care Note  Patient: Marcus [name]  Procedure(s) Performed: Tivis Ringer REMOVAL PEDIATRIC (N/A Chest)  Patient Location: PACU  Anesthesia Type:General  Level of Consciousness: drowsy  Airway & Oxygen Therapy: Patient Spontanous Breathing and Patient connected to face mask oxygen  Post-op Assessment: Report given to RN and Post -op Vital signs reviewed and stable  Post vital signs: Reviewed and stable  Last Vitals:  Vitals Value Taken Time  BP 124/67 01/13/20 1631  Temp    Pulse 93 01/13/20 1635  Resp 17 01/13/20 1635  SpO2 100 % 01/13/20 1635  Vitals shown include unvalidated device data.  Last Pain:  Vitals:   01/13/20 0800  TempSrc:   PainSc: 0-No pain      Patients Stated Pain Goal: 3 (01/13/20 0800)  Complications: No complications documented.

## 2020-01-13 NOTE — OR Nursing (Addendum)
Pt mother, Revonda Standard, called and updated @0924  and @1031 

## 2020-01-13 NOTE — Anesthesia Procedure Notes (Signed)
Procedure Name: Intubation Date/Time: 01/13/2020 8:41 AM Performed by: Candis Shine, CRNA Pre-anesthesia Checklist: Patient identified, Emergency Drugs available, Suction available and Patient being monitored Patient Re-evaluated:Patient Re-evaluated prior to induction Oxygen Delivery Method: Circle System Utilized Preoxygenation: Pre-oxygenation with 100% oxygen Induction Type: IV induction Ventilation: Mask ventilation without difficulty Laryngoscope Size: Mac and 4 Grade View: Grade I Tube type: Oral Tube size: 7.5 mm Number of attempts: 1 Airway Equipment and Method: Stylet Placement Confirmation: ETT inserted through vocal cords under direct vision,  positive ETCO2 and breath sounds checked- equal and bilateral Secured at: 22 cm Tube secured with: Tape Dental Injury: Teeth and Oropharynx as per pre-operative assessment

## 2020-01-13 NOTE — Op Note (Signed)
Pediatric Surgery Operative Note   Date of Operation: 01/13/2020  Room: [facility] OR ROOM 08  Pre-operative Diagnosis: PECTUS EXCAVATUM  Post-operative Diagnosis: PECTUS EXCAVATUM  Procedure(s): PECTUS BAR REMOVAL PEDIATRIC:   Surgeon(s): Surgeon(s) and Role:    * Kaeley Vinje, Felix Pacini, MD - Primary  Anesthesia Type:General  Anesthesia Staff:  Anesthesiologist: Lannie Fields, DO CRNA: Jed Limerick, CRNA; Waynard Edwards, CRNA; Adria Dill, CRNA  OR staff:  Circulator: [place], Karrie S, RN Radiology Technologist: Kathrynn Speed, RT; Carron Brazen, RT Relief Circulator: Graciella Freer, RN Relief Scrub: Harrie Foreman Scrub Person: Pietro Cassis Circulator Assistant: Dreama Saa, RN RN First Assistant: Woodroe Mode, RN; Doy Mince, RN   Operative Findings:  Bar encased within calcification  Images: None  Operative Note in Detail: Mihail is a 19 year old boy with pectus excavatum, s/p Nuss repair in December 2018. He comes to the operating arena for removal of the bar. Risks of the procedure were explained to  [place] and his parents. Risks include bleeding, injury to surrounding structures, infection, sepsis, and death. Aedyn signed consent.  Law was taken to the operating room and placed on the table in supine position. After adequate sedation, he was successfully intubated by anesthesia. A time-out was performed where all parties agreed to the name of the patient, the procedure, and administration of antibiotics. He was then prepped and draped in standard sterile fashion.  Attention was paid to the lateral chest wall that had two incisions on each side from the prior operation. We made the incision on the lower scar. The incision was carried down on each side through the subcutaneous tissue. The bar was located cephalad to the incision, so I had to mobilize the tissue subcutaneously to reach the bar. We required fluoroscopy to locate the  bar because it was not directly under our incision. We located the bar on the left lateral chest wall, which was partially encased in calcification. We used an osteotome and a rongeur to crack the calcifications off the bar. We were able to locate the stabilizer and removed the sutures. The stabilizer was removed and passed off the field. I then used the bar bender to straighten the bar.  We then proceeded to free the bar from the right side. It took some time and effort to locate the bar. We were able to identify the bar using fluoroscopy, however, the location appeared like a rib. After much dissection, we realized that the bar was almost totally encased in calcification, about 1 cm thick. It took about 1-2 hours to remove the calcifications and free the bar. We finally were able to straighten the bar on this right side. The bar then slid out without incident and was passed off the field. There was a moderate amount of blood oozing within the subcutaneous pocket throughout the case. I took extra time to make sure that hemostasis was excellent, including application of Surgicel. Once I was sure hemostasis was excellent, I injected Exparel mixed with 1% lidocaine with epinephrine into the incisions. The incisions were then copiously irrigated with normal saline. We then closed the incisions in multiple layers of absorbable suture. Dry dressing was placed on the incisions. We then sat Jakeim up and wrapped an ACE bandage around his chest for further hemostasis. He was then placed supine and extubated. He was taken to to the recovery room in stable condition. All counts were correct. No obvious complications.     Specimen: None  Drains: None  Estimated Blood Loss: 48mL  Complications: No immediate complications noted.  Disposition: PACU - hemodynamically stable.  ATTESTATION: I performed this procedure  Kandice Hams, MD

## 2020-01-14 ENCOUNTER — Encounter (HOSPITAL_COMMUNITY): Payer: Self-pay | Admitting: Surgery

## 2020-01-14 DIAGNOSIS — Q676 Pectus excavatum: Secondary | ICD-10-CM | POA: Diagnosis not present

## 2020-01-14 LAB — POCT I-STAT, CHEM 8
BUN: 18 mg/dL (ref 6–20)
Calcium, Ion: 1.21 mmol/L (ref 1.15–1.40)
Chloride: 103 mmol/L (ref 98–111)
Creatinine, Ser: 0.9 mg/dL (ref 0.61–1.24)
Glucose, Bld: 146 mg/dL — ABNORMAL HIGH (ref 70–99)
HCT: 40 % (ref 39.0–52.0)
Hemoglobin: 13.6 g/dL (ref 13.0–17.0)
Potassium: 4.9 mmol/L (ref 3.5–5.1)
Sodium: 139 mmol/L (ref 135–145)
TCO2: 23 mmol/L (ref 22–32)

## 2020-01-14 LAB — HEMOGLOBIN AND HEMATOCRIT, BLOOD
HCT: 37 % — ABNORMAL LOW (ref 39.0–52.0)
Hemoglobin: 12.5 g/dL — ABNORMAL LOW (ref 13.0–17.0)

## 2020-01-14 MED ORDER — OXYCODONE HCL 5 MG PO TABS: 5.0000 mg | ORAL_TABLET | ORAL | 0 refills | Status: AC | PRN

## 2020-01-14 MED ORDER — ACETAMINOPHEN 500 MG PO TABS: 1000.0000 mg | ORAL_TABLET | Freq: Four times a day (QID) | ORAL | 0 refills | Status: AC

## 2020-01-14 MED ORDER — POLYETHYLENE GLYCOL 3350 17 G PO PACK
17.0000 g | PACK | Freq: Once | ORAL | Status: AC
  Administered 2020-01-14: 17 g via ORAL
  Filled 2020-01-14: qty 1

## 2020-01-14 MED ORDER — IBUPROFEN 600 MG PO TABS: 600.0000 mg | ORAL_TABLET | Freq: Four times a day (QID) | ORAL | 0 refills | Status: AC

## 2020-01-14 NOTE — Anesthesia Postprocedure Evaluation (Signed)
Anesthesia Post Note  Patient: Marcus [name]  Procedure(s) Performed: Tivis Ringer REMOVAL PEDIATRIC (N/A Chest)     Patient location during evaluation: PACU Anesthesia Type: General Level of consciousness: awake and alert, oriented and patient cooperative Pain management: pain level controlled Vital Signs Assessment: post-procedure vital signs reviewed and stable Respiratory status: spontaneous breathing, nonlabored ventilation and respiratory function stable Cardiovascular status: blood pressure returned to baseline and stable Postop Assessment: no apparent nausea or vomiting Anesthetic complications: no   No complications documented.  Last Vitals:  Vitals:   01/14/20 0503 01/14/20 0810  BP: 112/63 114/67  Pulse: 70 72  Resp: 18   Temp: 36.8 C 36.9 C  SpO2: 99% 99%    Last Pain:  Vitals:   01/14/20 0829  TempSrc:   PainSc: 3    Pain Goal: Patients Stated Pain Goal: 3 (01/14/20 0829)                 Lannie Fields

## 2020-01-14 NOTE — Progress Notes (Signed)
Pediatric General Surgery Progress Note  Date of Admission:  01/13/2020 Hospital Day: 2 Age:  19 y.o. Primary Diagnosis: Pectus excavatum  Present on Admission: **None**   JOSUA FERREBEE is 1 Day Post-Op s/p Procedure(s) (LRB): PECTUS BAR REMOVAL PEDIATRIC (N/A)  Recent events (last 24 hours):  Received prn oxycodone x2  Subjective:   Marcus [name] states he is "doing great" this morning. He has some soreness on his sides, but is breathing without difficulty. He ate breakfast. He had a small hard stool this morning and is now drinking miralax. He walked around the nursing unit this morning. Vang wants to go home today.   Objective:   Temp (24hrs), Avg:98.3 F (36.8 C), Min:98.1 F (36.7 C), Max:98.4 F (36.9 C)  Temp:  [98.1 F (36.7 C)-98.4 F (36.9 C)] 98.4 F (36.9 C) (08/12 0810) Pulse Rate:  [70-107] 72 (08/12 0810) Resp:  [11-24] 18 (08/12 0503) BP: (109-132)/(60-85) 114/67 (08/12 0810) SpO2:  [98 %-100 %] 99 % (08/12 0810)   I/O last 3 completed shifts: In: 2960 [P.O.:360; I.V.:2350; IV Piggyback:250] Out: 1375 [Urine:1025; Blood:350] No intake/output data recorded.  Physical Exam: Gen: awake, alert, lying in bed, no acute distress CV: regular rate and rhythm, no murmur, cap refill <3 sec Lungs: clear to auscultation, unlabored breathing pattern Chest: no chest wall deformity; bilateral lateral chest incisions clean, dry, intact, steri-strips intact with small amount dried blood; mild bruising at posterior aspect of right chest incision; incisions covered with dry gauze and ACE wrap Abdomen: soft, non-distended MSK: MAE x4 Neuro: Mental status normal, normal strength and tone  Current Medications: . dextrose 5 % and 0.9 % NaCl with KCl 20 mEq/L 100 mL/hr at 01/14/20 0906   . acetaminophen  1,000 mg Oral Q6H  . ibuprofen  600 mg Oral Q6H  . polyethylene glycol  17 g Oral Once   ALPRAZolam, morphine injection, ondansetron (ZOFRAN) IV, oxyCODONE   Recent Labs   Lab 01/13/20 1445 01/14/20 0300  HGB 13.6 12.5*  HCT 40.0 37.0*   Recent Labs  Lab 01/13/20 1445  NA 139  K 4.9  CL 103  BUN 18  CREATININE 0.90  GLUCOSE 146*   No results for input(s): BILITOT, BILIDIR in the last 168 hours.  Recent Imaging: none  Assessment and Plan:  1 Day Post-Op s/p Procedure(s) (LRB): PECTUS BAR REMOVAL PEDIATRIC (N/A)  Dajaun Goldring is a 19 yo male POD #1 s/p pectus bar removal. He is doing well this morning. Pain is controlled with Toradol, Tylenol, and oxycodone. H&H stable. Minimal bruising at at incision sites. Tolerating a regular diet. Receiving miralax for hard stool. Ambulating independently without difficulty. Appropriate for discharge home today with phone call follow up.   [name], FNP-C Pediatric [facility] [phone] 01/14/2020 9:10 AM

## 2020-01-14 NOTE — Discharge Instructions (Signed)
-   Remove the ACE bandage and gauze tomorrow. - The stero-strips will fall of on their own. - You can shower tomorrow. Just sponge bath today.  - No swimming or submerging in water for 2 weeks. - No contact sports or lifting over 15 lbs for the next 4 weeks. - Call the office for: increased pain not relieved by pain medications, redness or swelling at the site, bleeding at the site

## 2020-01-14 NOTE — Discharge Summary (Signed)
Physician Discharge Summary  Patient ID: OLLIVANDER SEE MRN: [id number] DOB/AGE: 2000-10-19 19 y.o.  Admit date: 01/13/2020 Discharge date: 01/14/2020  Admission Diagnoses: Pectus Excavatum   Discharge Diagnoses:  Active Problems:   Pectus excavatum   Discharged Condition: good  Hospital Course: Larry Alcock is a 19 yo male s/p bar placement (Nuss procedure) for pectus excavatum in December 2018. He presented to the [facility] from home for scheduled bar removal. The bar was almost totally encased in calcification, but removed without incident (see operative note for details). An intra-operative H&H (13.6 & 40) was obtained due to a moderate amount of oozing during the case. Patient was admitted to the hospital for post-op observation. Patient's pain was well controlled Tylenol, Toradol, and oxycodone. There was a slight drop on morning H&H (12.5 & 37), but patient remained asymptomatic. Minimal incision site bruising. No chest wall abnormality observed. Received miralax for hard stool. Patient tolerated a regular diet. Patient able to ambulate independently and without difficulty. Patient was discharged home on POD #1 with plans for phone call follow up.   Consults: None  Significant Diagnostic Studies: none  Treatments: pectus bar removal  Discharge Exam: Blood pressure 114/67, pulse 72, temperature 98.4 F (36.9 C), temperature source Oral, resp. rate 18, height 6\' 3"  (1.905 m), weight 83.9 kg, SpO2 99 %. Physical Exam: Gen: awake, alert, lying in bed, no acute distress CV: regular rate and rhythm, no murmur, cap refill <3 sec Lungs: clear to auscultation, unlabored breathing pattern Chest: no chest wall deformity; bilateral lateral chest incisions clean, dry, intact, steri-strips intact with small amount dried blood; mild bruising at posterior aspect of right chest incision; incisions covered with dry gauze and ACE wrap Abdomen: soft, non-distended MSK: MAE x4 Neuro: Mental  status normal, normal strength and tone  Disposition: Discharge disposition: 01-Home or Self Care        Allergies as of 01/14/2020   No Known Allergies     Medication List    TAKE these medications   acetaminophen 500 MG tablet Commonly known as: TYLENOL Take 2 tablets (1,000 mg total) by mouth every 6 (six) hours.   ampicillin 500 MG capsule Commonly known as: PRINCIPEN Take 500 mg by mouth 2 (two) times daily.   Differin 0.1 % gel Generic drug: adapalene Apply 1 application topically daily as needed (acne).   ibuprofen 600 MG tablet Commonly known as: ADVIL Take 1 tablet (600 mg total) by mouth every 6 (six) hours.   oxyCODONE 5 MG immediate release tablet Commonly known as: Oxy IR/ROXICODONE Take 1 tablet (5 mg total) by mouth every 4 (four) hours as needed for up to 3 doses for severe pain (pain scale 6-10 of 10).       Follow-up Information    Dozier-Lineberger, 03/15/2020, NP Follow up.   Specialty: Pediatrics Why: You will receive a phone call from Leul Narramore in 7-10 days to check on your post-op progress. Contact information: [address] [place] [address] [place] [phone]               Signed: [phone] 01/14/2020, 11:30 AM

## 2020-01-14 NOTE — Progress Notes (Signed)
Discharge instructions reviewed at bedside with patient and father.  Both verbalized understanding and declined further instruction.  Patient transported off unit for discharge via wheelchair and volunteer services.

## 2020-01-15 ENCOUNTER — Telehealth (INDEPENDENT_AMBULATORY_CARE_PROVIDER_SITE_OTHER): Payer: Self-pay | Admitting: Nurse Practitioner

## 2020-01-15 ENCOUNTER — Encounter (INDEPENDENT_AMBULATORY_CARE_PROVIDER_SITE_OTHER): Payer: Self-pay | Admitting: Surgery

## 2020-01-15 NOTE — Telephone Encounter (Signed)
I returned Ms. Narula's phone call. Marcus [name] is POD #2 s/p pectus bar removal. Ms. Navarrete asked if Marcus [name] was supposed to be on an antibiotic, other than his usually acne medication (ampicillin). I informed Ms. Wheller that Marcus [name] was not prescribed an additional antibiotic. His acne medications were left the same. She states the ACE wrap and gauze bandages were removed today. She states there is quite a bit of swelling at the incision sites, mostly on the right side. She states Marcus [name] has not had a lot of pain, but does have some trouble sleeping. He had not taken any oxycodone. I advised he continue taking ibuprofen before bed. If he continues to have difficulty sleeping due to pain, he should take the oxycodone. Ms. Whitefield states there is a rash near the incision sites. She is unsure if this is related to chafing or not. I requested she send a picture through mychart.

## 2020-01-15 NOTE — Telephone Encounter (Signed)
Spoke with mom and she indicates that he was supposed to be taking an antibiotic, but the only antibiotic that he has is the 500mg  ampicillin.   Mom was under the impression that there was an alternate antibiotic that was going to be called in.   Mom took the bandage off and wanted to ask about what is normal, and if there needs to be changes to the pain medications.   Mom states that the phone number [phone] is the best number to contact her at. DPR on file to speak with her.

## 2020-01-15 NOTE — Telephone Encounter (Signed)
Who's calling (name and relationship to patient) : Orvie Caradine mom  Best contact number: [phone]  Provider they see: Mayah Dozier-lineberger  Reason for call: Mom has questions regarding medication that was prescribed to patient when discharged from the hospital   Call ID:      PRESCRIPTION REFILL ONLY  Name of prescription:  Pharmacy:

## 2020-01-16 NOTE — Telephone Encounter (Signed)
POD #3 s/p pectus bar removal  I have been in communication with Marcus [name]'s parents via e-mail last night and today concerning the redness and swelling and itching adjacent to Marcus [name]'s incisions. I viewed the pictures. I advised re-wrapping with the ACE bandage and applying warm compress to the armpits. He may take Benadryl for the itching.  I explained to parents via e-mail that I do not believe Marcus [name] requires antibiotics at this moment. Mother suggested hydrocortisone but I strongly advised against this. Mother believes Marcus [name] may be allergic to the bed sheet detergent.  I spoke to Marcus [name] today. He states he is doing well but his armpits are itching. He was placing pillows under his arms and he thinks he may be allergic to the detergent for the pillowcases. I recommended applying Benadryl soothing gel to the area, then warm compress for a few hours, ACE bandage until tomorrow. I told him not to apply hydrocortisone. I explained that the rash may be the beginning of bruising. Marcus [name] expressed understanding.  Marcus [name] O. Alyra Patty, MD, [facility]

## 2020-01-17 ENCOUNTER — Telehealth (INDEPENDENT_AMBULATORY_CARE_PROVIDER_SITE_OTHER): Payer: Self-pay | Admitting: Surgery

## 2020-01-17 DIAGNOSIS — Q676 Pectus excavatum: Secondary | ICD-10-CM

## 2020-01-17 MED ORDER — TRIAMCINOLONE ACETONIDE 0.5 % EX OINT: 1.0000 "application " | TOPICAL_OINTMENT | Freq: Two times a day (BID) | CUTANEOUS | 0 refills | Status: AC

## 2020-01-17 NOTE — Telephone Encounter (Signed)
I called [name] today after mother sent pictures to me (via e-mail) of Marcus [name]'s skin. The erythema appears worse and well-demarcated, like contact dermatitis. I prescribed Kenalog ointment with instructions to apply to the red skin only and not the incisions. He should wrap his chest with non-adhesive ACE bandage to protect the incision. [name] also mentioned that the swelling is getting worse, but he noticed some yellow, crusty stuff on his skin (not from the incision).

## 2020-01-18 ENCOUNTER — Encounter (INDEPENDENT_AMBULATORY_CARE_PROVIDER_SITE_OTHER): Payer: Self-pay | Admitting: Nurse Practitioner

## 2020-01-18 ENCOUNTER — Telehealth (INDEPENDENT_AMBULATORY_CARE_PROVIDER_SITE_OTHER): Payer: Self-pay | Admitting: Nurse Practitioner

## 2020-01-18 NOTE — Telephone Encounter (Signed)
I spoke with [name] and Marcus [name]. [name] states the rash is "about the same," but the swelling is better. They have applied the triamcinolone cream once so far. He wore the ACE wrap for about 30 minutes. He was worried the ACE wrap was making the rash worse. Marcus [name] states he has very sensitive skin. He has been taking Benadryl "around the clock." Marcus [name] states he wakes up sweating at night and thinks it may be the result of sleeping under a thick blanket. He thinks this may be contributing to the rash. Marcus [name] denies any pain. He is planning to return to college tomorrow. I advised to continue the triamcinolone cream BID, sleep under a lighter blanket to prevent sweating, continue Benadryl as needed, and call for any new changes. Marcus [name] and [name] verbalized understanding and agreement with this plan.

## 2022-07-05 ENCOUNTER — Encounter (INDEPENDENT_AMBULATORY_CARE_PROVIDER_SITE_OTHER): Payer: Self-pay

## 2022-10-08 ENCOUNTER — Encounter (INDEPENDENT_AMBULATORY_CARE_PROVIDER_SITE_OTHER): Payer: Self-pay

## 2023-10-23 ENCOUNTER — Other Ambulatory Visit (HOSPITAL_COMMUNITY): Payer: Self-pay

## 2023-10-23 MED ORDER — LISDEXAMFETAMINE DIMESYLATE 30 MG PO CAPS
30.0000 mg | ORAL_CAPSULE | Freq: Every morning | ORAL | 0 refills | Status: AC
  Filled 2023-10-23: qty 30, 30d supply, fill #0

## 2023-10-24 ENCOUNTER — Other Ambulatory Visit (HOSPITAL_COMMUNITY): Payer: Self-pay

## 2023-10-25 ENCOUNTER — Other Ambulatory Visit (HOSPITAL_COMMUNITY): Payer: Self-pay
